# Patient Record
Sex: Female | Born: 1980 | Race: White | Hispanic: No | Marital: Single
Health system: Southern US, Community
[De-identification: ages and names within clinical notes are randomized; demographics above are authoritative.]

## PROBLEM LIST (undated history)

## (undated) DIAGNOSIS — I10 Essential (primary) hypertension: Secondary | ICD-10-CM

## (undated) DIAGNOSIS — E119 Type 2 diabetes mellitus without complications: Secondary | ICD-10-CM

---

## 2018-09-26 ENCOUNTER — Emergency Department
Admission: EM | Admit: 2018-09-26 | Discharge: 2018-09-26 | Disposition: A | Discharge status: Home / Self Care | Payer: Worker's Compensation | Attending: Emergency Medicine | Admitting: Emergency Medicine

## 2018-09-26 ENCOUNTER — Emergency Department: Payer: Worker's Compensation

## 2018-09-26 ENCOUNTER — Encounter: Payer: Self-pay | Admitting: Emergency Medicine

## 2018-09-26 ENCOUNTER — Other Ambulatory Visit: Payer: Self-pay

## 2018-09-26 DIAGNOSIS — Z7722 Contact with and (suspected) exposure to environmental tobacco smoke (acute) (chronic): Secondary | ICD-10-CM | POA: Insufficient documentation

## 2018-09-26 DIAGNOSIS — Y939 Activity, unspecified: Secondary | ICD-10-CM | POA: Insufficient documentation

## 2018-09-26 DIAGNOSIS — E119 Type 2 diabetes mellitus without complications: Secondary | ICD-10-CM | POA: Insufficient documentation

## 2018-09-26 DIAGNOSIS — I10 Essential (primary) hypertension: Secondary | ICD-10-CM | POA: Diagnosis not present

## 2018-09-26 DIAGNOSIS — X501XXA Overexertion from prolonged static or awkward postures, initial encounter: Secondary | ICD-10-CM | POA: Insufficient documentation

## 2018-09-26 DIAGNOSIS — Y929 Unspecified place or not applicable: Secondary | ICD-10-CM | POA: Insufficient documentation

## 2018-09-26 DIAGNOSIS — Y999 Unspecified external cause status: Secondary | ICD-10-CM | POA: Insufficient documentation

## 2018-09-26 DIAGNOSIS — S99912A Unspecified injury of left ankle, initial encounter: Secondary | ICD-10-CM | POA: Diagnosis present

## 2018-09-26 DIAGNOSIS — S93402A Sprain of unspecified ligament of left ankle, initial encounter: Secondary | ICD-10-CM | POA: Insufficient documentation

## 2018-09-26 HISTORY — DX: Essential (primary) hypertension: I10

## 2018-09-26 HISTORY — DX: Type 2 diabetes mellitus without complications: E11.9

## 2018-09-26 MED ORDER — IBUPROFEN 600 MG PO TABS: 600.0000 mg | ORAL_TABLET | Freq: Four times a day (QID) | ORAL | 0 refills | Status: AC | PRN

## 2018-09-26 NOTE — ED Triage Notes (Signed)
Patient states that at work last night she tripped over a roll of labels in the floor. Patient states that she fell and continues to have pain to top of left foot and ankle.

## 2018-09-26 NOTE — ED Provider Notes (Signed)
[facility] Emergency Department Provider Note  ____________________________________________  Time seen: Approximately 8:56 PM  I have reviewed the triage vital signs and the nursing notes.   HISTORY  Chief Complaint Foot Pain    HPI Krystal Underwood is a 38 y.o. female that presents to the emergency department for evaluation of left foot pain after injury at work yesterday.  Pain is to the side of her ankle and foot.  Patient rolled her ankle on a role of labels at work.  She continued to work today.  Past Medical History:  Diagnosis Date  . Diabetes mellitus without complication (HCC)   . Hypertension     There are no active problems to display for this patient.   Past Surgical History:  Procedure Laterality Date  . CESAREAN SECTION      Prior to Admission medications   Medication Sig Start Date End Date Taking? Authorizing Provider  ibuprofen (ADVIL) 600 MG tablet Take 1 tablet (600 mg total) by mouth every 6 (six) hours as needed. 09/26/18   Enid DerryWagner, Ameirah Khatoon, PA-C    Allergies Morphine and related  No family history on file.  Social History Social History   Tobacco Use  . Smoking status: Passive Smoke Exposure - Never Smoker  . Smokeless tobacco: Never Used  Substance Use Topics  . Alcohol use: Not on file  . Drug use: Not on file     Review of Systems  Respiratory: No SOB. Gastrointestinal: No abdominal pain.  No nausea, no vomiting.  Musculoskeletal: Positive for foot pain.  Skin: Negative for rash, abrasions, lacerations, ecchymosis. Neurological: Negative for headaches, numbness or tingling   ____________________________________________   PHYSICAL EXAM:  VITAL SIGNS: ED Triage Vitals  Enc Vitals Group     BP 09/26/18 1931 133/78     Pulse Rate 09/26/18 1931 80     Resp 09/26/18 1931 18     Temp 09/26/18 1931 98.7 F (37.1 C)     Temp Source 09/26/18 1931 Oral     SpO2 09/26/18 1931 95 %     Weight 09/26/18 1933 (!) 329  lb (149.2 kg)     Height 09/26/18 1933 6\' 1"  (1.854 m)     Head Circumference --      Peak Flow --      Pain Score 09/26/18 1933 2     Pain Loc --      Pain Edu? --      Excl. in GC? --      Constitutional: Alert and oriented. Well appearing and in no acute distress. Eyes: Conjunctivae are normal. PERRL. EOMI. Head: Atraumatic. ENT:      Ears:      Nose: No congestion/rhinnorhea.      Mouth/Throat: Mucous membranes are moist.  Neck: No stridor.  Cardiovascular: Normal rate, regular rhythm.  Good peripheral circulation. Respiratory: Normal respiratory effort without tachypnea or retractions. Lungs CTAB. Good air entry to the bases with no decreased or absent breath sounds. Musculoskeletal: Full range of motion to all extremities. No gross deformities appreciated.  Tenderness to palpation and swelling to lateral malleolus and lateral foot. Neurologic:  Normal speech and language. No gross focal neurologic deficits are appreciated.  Skin:  Skin is warm, dry and intact. No rash noted. Psychiatric: Mood and affect are normal. Speech and behavior are normal. Patient exhibits appropriate insight and judgement.   ____________________________________________   LABS (all labs ordered are listed, but only abnormal results are displayed)  Labs Reviewed - No data  to display ____________________________________________  EKG   ____________________________________________  RADIOLOGY Robinette Haines, personally viewed and evaluated these images (plain radiographs) as part of my medical decision making, as well as reviewing the written report by the radiologist.  Dg Ankle Complete Left  Result Date: 09/26/2018 CLINICAL DATA:  38 year old female with fall and left foot pain. EXAM: LEFT FOOT - COMPLETE 3+ VIEW; LEFT ANKLE COMPLETE - 3+ VIEW COMPARISON:  None FINDINGS: There is no acute fracture or dislocation. The bones are well mineralized. No arthritic changes. There is a 1 cm plantar  calcaneal spur. The soft tissues are unremarkable. IMPRESSION: Negative. Electronically Signed   By: Anner Crete M.D.   On: 09/26/2018 20:30   Dg Foot Complete Left  Result Date: 09/26/2018 CLINICAL DATA:  38 year old female with fall and left foot pain. EXAM: LEFT FOOT - COMPLETE 3+ VIEW; LEFT ANKLE COMPLETE - 3+ VIEW COMPARISON:  None FINDINGS: There is no acute fracture or dislocation. The bones are well mineralized. No arthritic changes. There is a 1 cm plantar calcaneal spur. The soft tissues are unremarkable. IMPRESSION: Negative. Electronically Signed   By: Anner Crete M.D.   On: 09/26/2018 20:30    ____________________________________________    PROCEDURES  Procedure(s) performed:    Procedures    Medications - No data to display   ____________________________________________   INITIAL IMPRESSION / ASSESSMENT AND PLAN / ED COURSE  Pertinent labs & imaging results that were available during my care of the patient were reviewed by me and considered in my medical decision making (see chart for details).  Review of the [place] CSRS was performed in accordance of the [place] prior to dispensing any controlled drugs.   Patient's diagnosis is consistent with ankle sprain.  Vital signs and exam are reassuring.  Foot and ankle x-ray are negative for acute abnormalities.  Ankle stirrup was placed.  Patient declines crutches.  Patient will be discharged home with prescriptions for Motrin. Patient is to follow up with orthopedics as directed. Patient is given ED precautions to return to the ED for any worsening or new symptoms.  Krystal Underwood was evaluated in Emergency Department on 09/26/2018 for the symptoms described in the history of present illness. She was evaluated in the context of the global COVID-19 pandemic, which necessitated consideration that the patient might be at risk for infection with the SARS-CoV-2 virus that causes COVID-19. Institutional protocols and algorithms that  pertain to the evaluation of patients at risk for COVID-19 are in a state of rapid change based on information released by regulatory bodies including the CDC and federal and state organizations. These policies and algorithms were followed during the patient's care in the ED.   ____________________________________________  FINAL CLINICAL IMPRESSION(S) / ED DIAGNOSES  Final diagnoses:  Sprain of left ankle, unspecified ligament, initial encounter      NEW MEDICATIONS STARTED DURING THIS VISIT:  ED Discharge Orders         Ordered    ibuprofen (ADVIL) 600 MG tablet  Every 6 hours PRN     09/26/18 2129              This chart was dictated using voice recognition software/Dragon. Despite best efforts to proofread, errors can occur which can change the meaning. Any change was purely unintentional.    Laban Emperor, PA-C 09/26/18 2325    Nance Pear, MD 09/27/18 1515

## 2020-10-27 IMAGING — DX DG ANKLE COMPLETE 3+V*L*
3 series · 3 of 3 positions shown · non-contrast
Comparison: None

CLINICAL DATA: 37-year-old female with fall and left foot pain.

EXAM:
LEFT FOOT - COMPLETE 3+ VIEW; LEFT ANKLE COMPLETE - 3+ VIEW

[ankle ap]
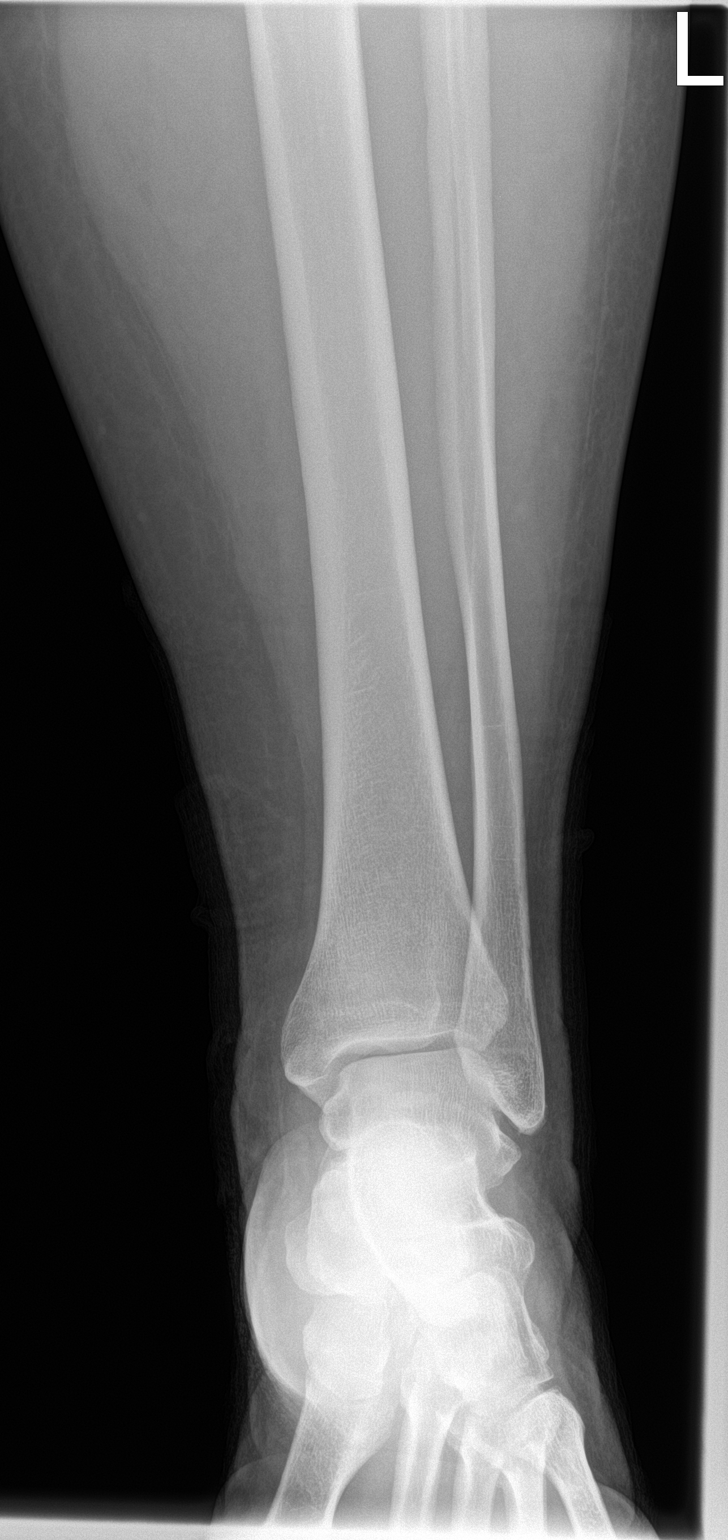

[ankle obl]
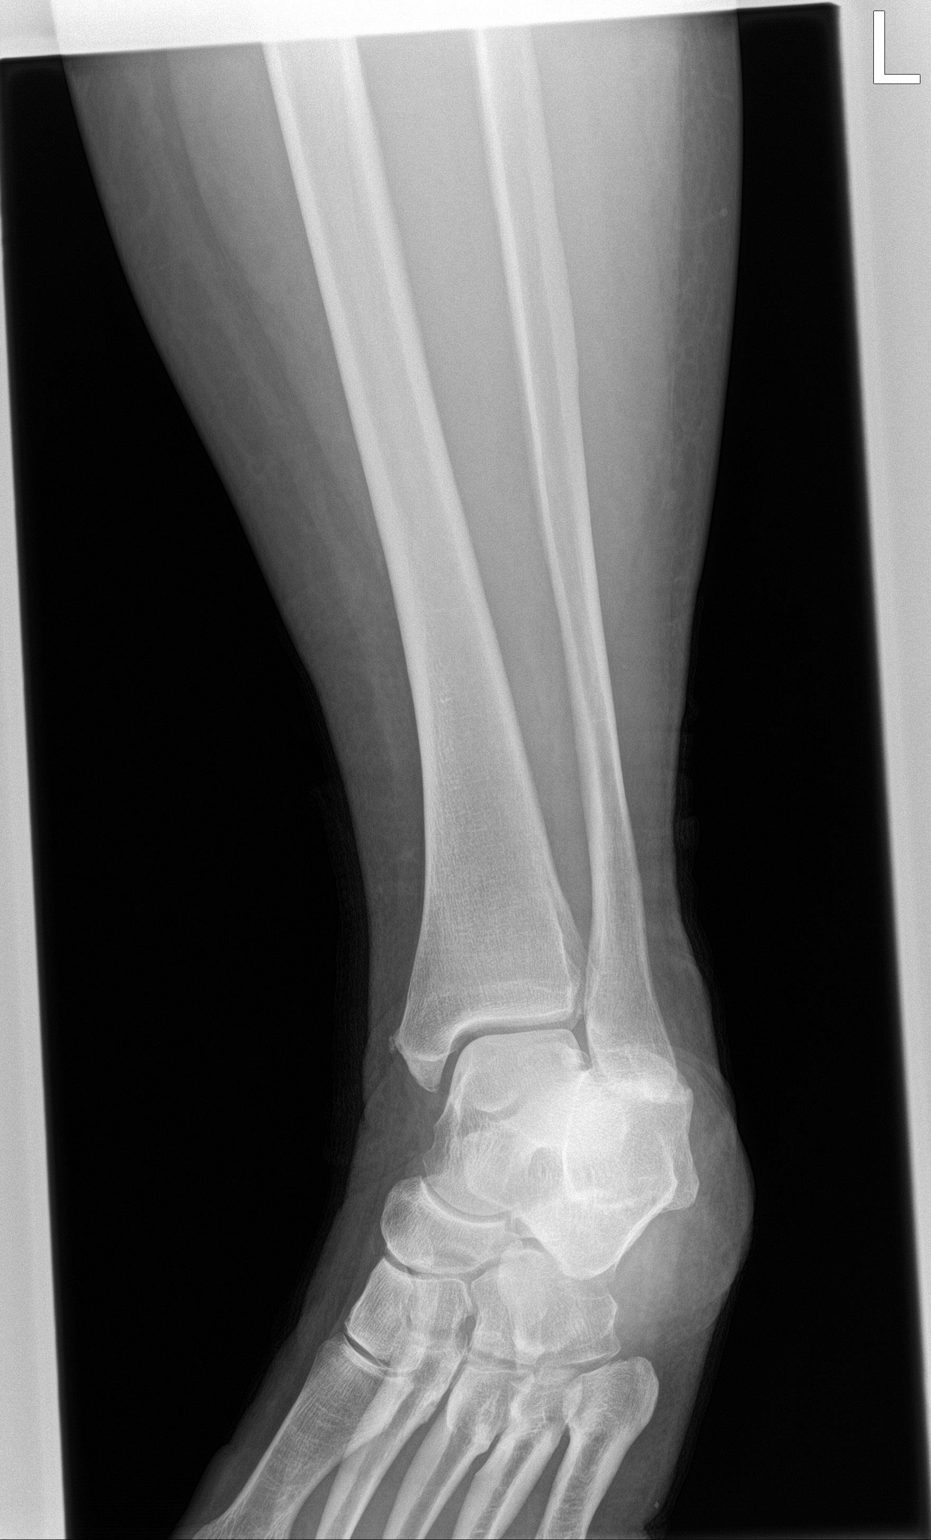

[ankle lat]
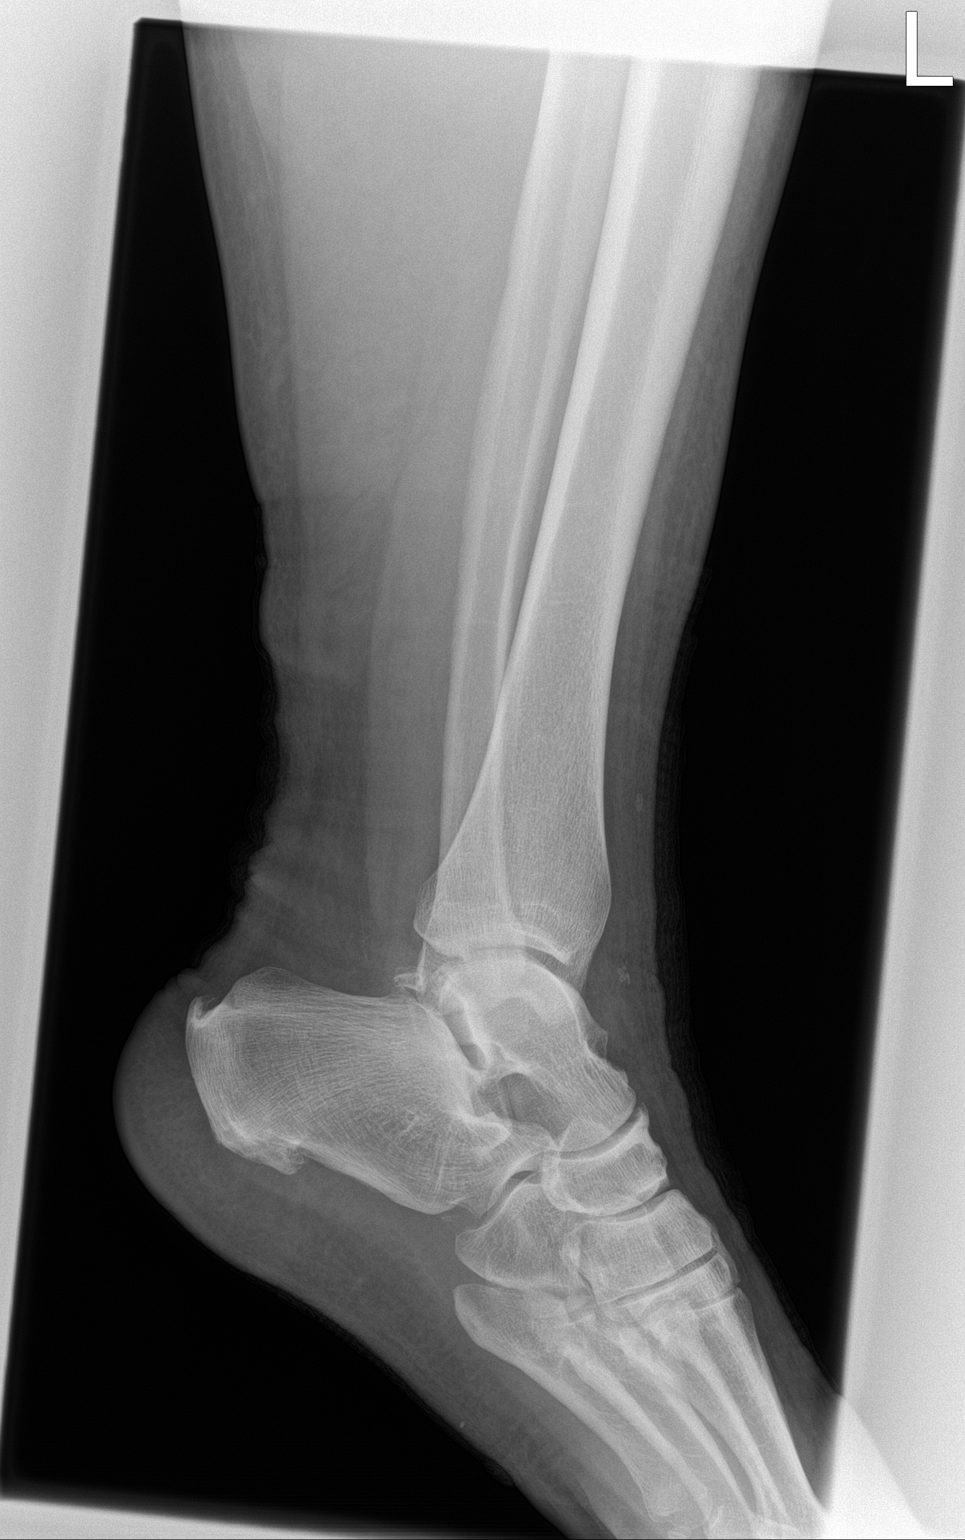

[3 of 3 positions shown; findings below may reference images not displayed]

FINDINGS: There is no acute fracture or dislocation. The bones are well
mineralized. No arthritic changes. There is a 1 cm plantar calcaneal
spur. The soft tissues are unremarkable.
IMPRESSION: Negative.
